# Patient Record
Sex: Female | Born: 1965 | Race: White | Hispanic: No | Smoking: Never smoker
Health system: Southern US, Community
[De-identification: ages and names within clinical notes are randomized; demographics above are authoritative.]

## PROBLEM LIST (undated history)

## (undated) HISTORY — PX: CHOLECYSTECTOMY: SHX55

---

## 1996-07-18 HISTORY — PX: TUBAL LIGATION: SHX77

## 1998-08-14 HISTORY — PX: CHOLECYSTECTOMY: SHX55

## 2002-02-17 HISTORY — PX: BREAST BIOPSY: SHX20

## 2004-01-29 ENCOUNTER — Emergency Department (HOSPITAL_COMMUNITY): Admission: EM | Admit: 2004-01-29 | Discharge: 2004-01-29 | Payer: Self-pay | Admitting: Emergency Medicine

## 2004-08-23 ENCOUNTER — Encounter: Admission: RE | Admit: 2004-08-23 | Discharge: 2004-08-23 | Payer: Self-pay | Admitting: Family Medicine

## 2007-04-30 ENCOUNTER — Emergency Department (HOSPITAL_COMMUNITY): Admission: EM | Admit: 2007-04-30 | Discharge: 2007-04-30 | Payer: Self-pay | Admitting: Emergency Medicine

## 2009-06-22 ENCOUNTER — Encounter: Admission: RE | Admit: 2009-06-22 | Discharge: 2009-06-22 | Payer: Self-pay | Admitting: Family Medicine

## 2010-08-08 ENCOUNTER — Encounter: Payer: Self-pay | Admitting: Family Medicine

## 2010-08-26 ENCOUNTER — Other Ambulatory Visit: Payer: Self-pay | Admitting: Obstetrics and Gynecology

## 2010-09-09 ENCOUNTER — Other Ambulatory Visit (HOSPITAL_COMMUNITY): Payer: Self-pay

## 2010-12-14 ENCOUNTER — Other Ambulatory Visit: Payer: Self-pay | Admitting: Family Medicine

## 2010-12-14 DIAGNOSIS — Z1231 Encounter for screening mammogram for malignant neoplasm of breast: Secondary | ICD-10-CM

## 2014-12-06 ENCOUNTER — Emergency Department (INDEPENDENT_AMBULATORY_CARE_PROVIDER_SITE_OTHER): Payer: BLUE CROSS/BLUE SHIELD

## 2014-12-06 ENCOUNTER — Emergency Department (HOSPITAL_COMMUNITY)
Admission: EM | Admit: 2014-12-06 | Discharge: 2014-12-06 | Disposition: A | Discharge status: Home / Self Care | Payer: Self-pay | Source: Home / Self Care

## 2014-12-06 ENCOUNTER — Encounter (HOSPITAL_COMMUNITY): Payer: Self-pay | Admitting: Emergency Medicine

## 2014-12-06 ENCOUNTER — Emergency Department (INDEPENDENT_AMBULATORY_CARE_PROVIDER_SITE_OTHER)
Admission: EM | Admit: 2014-12-06 | Discharge: 2014-12-06 | Disposition: A | Discharge status: Home / Self Care | Payer: BLUE CROSS/BLUE SHIELD | Source: Home / Self Care | Attending: Family Medicine | Admitting: Family Medicine

## 2014-12-06 DIAGNOSIS — M7741 Metatarsalgia, right foot: Secondary | ICD-10-CM | POA: Diagnosis not present

## 2014-12-06 MED ORDER — DICLOFENAC SODIUM 75 MG PO TBEC: 75.0000 mg | DELAYED_RELEASE_TABLET | Freq: Two times a day (BID) | ORAL | Status: DC

## 2014-12-06 NOTE — ED Provider Notes (Signed)
CSN: [id number]     Arrival date & time 12/06/14  1311 History   First MD Initiated Contact with Patient 12/06/14 1326     Chief Complaint  Patient presents with  . Foot Pain   (Consider location/radiation/quality/duration/timing/severity/associated sxs/prior Treatment) HPI  Right foot pain. Started 2 months ago after box fell on that work. Box fell on the top of her foot though her bottom is probably on the bottom of the foot at the ball of the foot. Pain worsens over the course of the day as patient is on her feet. She works on her feet all day. Pain has improved with the change in footwear recently. Overall the pain is unchanged since onset. Occasional swelling on the top of the foot. Ibuprofen with some relief. Pain comes and goes. Denies any numbness in the toes.  Denies fevers, other joint effusions or involvement, and explained rashes,.  History reviewed. No pertinent past medical history. Past Surgical History  Procedure Laterality Date  . Cholecystectomy     Family History  Problem Relation Age of Onset  . Asthma Neg Hx   . Cancer Neg Hx   . Diabetes Neg Hx   . Heart failure Neg Hx   . Hyperlipidemia Neg Hx    History  Substance Use Topics  . Smoking status: Never Smoker   . Smokeless tobacco: Not on file  . Alcohol Use: No   OB History    No data available     Review of Systems Per HPI with all other pertinent systems negative.   Allergies  Review of patient's allergies indicates no known allergies.  Home Medications   Prior to Admission medications   Not on File   BP 162/90 mmHg  Pulse 89  Temp(Src) 98.5 F (36.9 C) (Oral)  SpO2 97%  LMP 11/16/2014 Physical Exam Physical Exam  Constitutional: oriented to person, place, and time. appears well-developed and well-nourished. No distress.  HENT:  Head: Normocephalic and atraumatic.  Eyes: EOMI. PERRL.  Neck: Normal range of motion.  Cardiovascular: RRR, no m/r/g, 2+ distal pulses,  Pulmonary/Chest:  Effort normal and breath sounds normal. No respiratory distress.  Abdominal: Soft. Bowel sounds are normal. NonTTP, no distension.  Musculoskeletal: Right foot with normal range of motion, no appreciable effusions or swelling. Per patient pain is somewhat relieved with additional pressure applied to the second to third MTPs.  Neurological: alert and oriented to person, place, and time.  Skin: Skin is warm. No rash noted. non diaphoretic.  Psychiatric: normal mood and affect. behavior is normal. Judgment and thought content normal.   ED Course  Procedures (including critical care time) Labs Review Labs Reviewed - No data to display  Imaging Review No results found.   MDM   1. Metatarsalgia, right     Plain film of the right foot without evidence of fracture or other acute abnormality. Patiently start using metatarsal pads and seek further medical attention at sports medicine for possible custom orthotics if needed. Anti-inflammatories as needed.   Hypertension: Single incident of elevation. Patient without history of previous treatment or history of limited blood pressure. Will follow with PCP if needed.    Ozella Rocksavid J Merrell, MD 12/06/14 1409

## 2014-12-06 NOTE — ED Notes (Signed)
Pt comes in with throb,burning pain across right posterior foot x 2 mnths intermit. States a heavy box fell on toes 2 mnths ago, with worsening pain Pt has tried OTC Bio-freeze, ice and meds without relief

## 2014-12-06 NOTE — Discharge Instructions (Signed)
There is no evidence of fracture in her foot. Her symptoms are likely due to a condition called metatarsalgia. Please purchase and start using a metatarsal pad. Please use ibuprofen for additional pain relief. You may need to go and have custom orthotics made for your foot to help with pain, and for additional support

## 2016-02-01 ENCOUNTER — Other Ambulatory Visit: Payer: Self-pay | Admitting: Family Medicine

## 2016-02-01 DIAGNOSIS — R52 Pain, unspecified: Secondary | ICD-10-CM

## 2016-02-25 ENCOUNTER — Ambulatory Visit: Payer: Self-pay | Admitting: Family

## 2020-11-16 ENCOUNTER — Other Ambulatory Visit: Payer: Self-pay | Admitting: Obstetrics and Gynecology

## 2020-11-16 DIAGNOSIS — Z1231 Encounter for screening mammogram for malignant neoplasm of breast: Secondary | ICD-10-CM

## 2020-12-10 ENCOUNTER — Ambulatory Visit: Admission: RE | Admit: 2020-12-10 | Payer: BLUE CROSS/BLUE SHIELD | Source: Ambulatory Visit

## 2020-12-10 ENCOUNTER — Other Ambulatory Visit: Payer: Self-pay

## 2020-12-10 ENCOUNTER — Ambulatory Visit: Payer: Self-pay | Admitting: *Deleted

## 2020-12-10 VITALS — BP 138/88 | Wt 209.9 lb

## 2020-12-10 DIAGNOSIS — N644 Mastodynia: Secondary | ICD-10-CM

## 2020-12-10 DIAGNOSIS — Z01419 Encounter for gynecological examination (general) (routine) without abnormal findings: Secondary | ICD-10-CM

## 2020-12-10 NOTE — Progress Notes (Signed)
[name] is a 55 y.o. G2P0 female who presents to [facility] clinic today with complaint of right outer breast pain near biopsy site x 2 months. Patient states the pain comes and goes. Patient rates the pain at a 6 out of 10..    Pap Smear: Pap smear completed today. Last Pap smear was 3-4 years ago at [facility] health care at [facility] clinic and was normal per patient. Per patient has no history of an abnormal Pap smear. Last Pap smear result is not available in Epic.   Physical exam: Breasts Breasts symmetrical. No skin abnormalities bilateral breasts. No nipple retraction bilateral breasts. No nipple discharge bilateral breasts. No lymphadenopathy. No lumps palpated bilateral breasts. Complaints of right upper outer quadrant pain on exam.      Pelvic/Bimanual Ext Genitalia No lesions, no swelling and no discharge observed on external genitalia.        Vagina Vagina pink and normal texture. No lesions or discharge observed in vagina.        Cervix Cervix is present. Cervix pink and of normal texture. No discharge observed.    Uterus Uterus is present and palpable. Uterus in normal position and normal size.        Adnexae Bilateral ovaries present and palpable. No tenderness on palpation.         Rectovaginal No rectal exam completed today since patient had no rectal complaints. No skin abnormalities observed on exam.     Smoking History: Patient has never smoked.  Patient Navigation: Patient education provided. Access to services provided for patient through [facility] program.   Colorectal Cancer Screening: Per patient has never had colonoscopy completed. No complaints today.    Breast and Cervical Cancer Risk Assessment: Patient does not have family history of breast cancer, known genetic mutations, or radiation treatment to the chest before age 81. Patient does not have history of cervical dysplasia, immunocompromised, or DES exposure in-utero.  Risk  Assessment    Risk Scores      12/10/2020   Last edited by: Meryl Dare, CMA   5-year risk: 1 %   Lifetime risk: 6.9 %         A: [facility] exam with pap smear Complaint of right outer breast pain.  P: Referred patient to the [facility] for a diagnostic mammogram. Appointment scheduled Thursday, December 31, 2020 at 0940.  Priscille Heidelberg, RN 12/10/2020 9:38 AM

## 2020-12-10 NOTE — Patient Instructions (Addendum)
Explained breast self awareness with [facility]. Pap smear completed today. Let her know [facility] will cover Pap smears and HPV typing every 5 years unless has a history of abnormal Pap smears. Referred patient to the [facility] for a diagnostic mammogram. Appointment scheduled Thursday, December 31, 2020 at 0940. Patient aware of appointment and will be there. Let patient know will follow-up with her within the next couple of weeks with results of her Pap smear by phone or letter. Oprah Camarena verbalized understanding.  Gigi Onstad, Kathaleen Maser, RN 9:38 AM

## 2020-12-15 ENCOUNTER — Telehealth: Payer: Self-pay

## 2020-12-15 LAB — CYTOLOGY - PAP
Adequacy: ABSENT
Comment: NEGATIVE
Diagnosis: NEGATIVE
Diagnosis: REACTIVE
High risk HPV: NEGATIVE

## 2020-12-15 NOTE — Telephone Encounter (Signed)
Left message for patient about pap results. Left name and number for patient to call back.

## 2020-12-15 NOTE — Telephone Encounter (Signed)
Spoke with patient to give pap smear results. Informed patient that pap was normal and HPV was negative. Next pap smear will be due in 5 years. Patient voiced understanding.

## 2020-12-31 ENCOUNTER — Other Ambulatory Visit: Payer: Self-pay

## 2020-12-31 ENCOUNTER — Ambulatory Visit
Admission: RE | Admit: 2020-12-31 | Discharge: 2020-12-31 | Disposition: A | Discharge status: Home / Self Care | Payer: No Typology Code available for payment source | Source: Ambulatory Visit | Attending: Obstetrics and Gynecology | Admitting: Obstetrics and Gynecology

## 2020-12-31 ENCOUNTER — Ambulatory Visit
Admission: RE | Admit: 2020-12-31 | Discharge: 2020-12-31 | Disposition: A | Discharge status: Home / Self Care | Payer: BLUE CROSS/BLUE SHIELD | Source: Ambulatory Visit | Attending: Obstetrics and Gynecology | Admitting: Obstetrics and Gynecology

## 2020-12-31 DIAGNOSIS — N644 Mastodynia: Secondary | ICD-10-CM

## 2022-01-31 ENCOUNTER — Other Ambulatory Visit (HOSPITAL_COMMUNITY): Payer: Self-pay | Admitting: Obstetrics and Gynecology

## 2022-01-31 DIAGNOSIS — Z1231 Encounter for screening mammogram for malignant neoplasm of breast: Secondary | ICD-10-CM

## 2022-03-11 ENCOUNTER — Inpatient Hospital Stay: Payer: Self-pay

## 2022-03-11 ENCOUNTER — Inpatient Hospital Stay (HOSPITAL_COMMUNITY): Admission: RE | Admit: 2022-03-11 | Payer: Self-pay | Source: Ambulatory Visit

## 2022-06-03 IMAGING — MG DIGITAL DIAGNOSTIC BILAT W/ TOMO W/ CAD
8 of 15 series · 8 of 40 positions shown · non-contrast
Comparison: Previous exam(s).

CLINICAL DATA: 54-year-old female presenting with focal pain in the
right breast. Patient reports a work injury approximately 1.5 months
ago with bruising in the right breast. The bruising has resolved but
there is residual tenderness. No associated lump.

EXAM:
DIGITAL DIAGNOSTIC BILATERAL MAMMOGRAM WITH TOMOSYNTHESIS AND CAD;
ULTRASOUND RIGHT BREAST LIMITED
TECHNIQUE: Bilateral digital diagnostic mammography and breast tomosynthesis
was performed. The images were evaluated with computer-aided
detection.; Targeted ultrasound examination of the right breast was
performed

[R MLO synth-2D (1 of 2)]
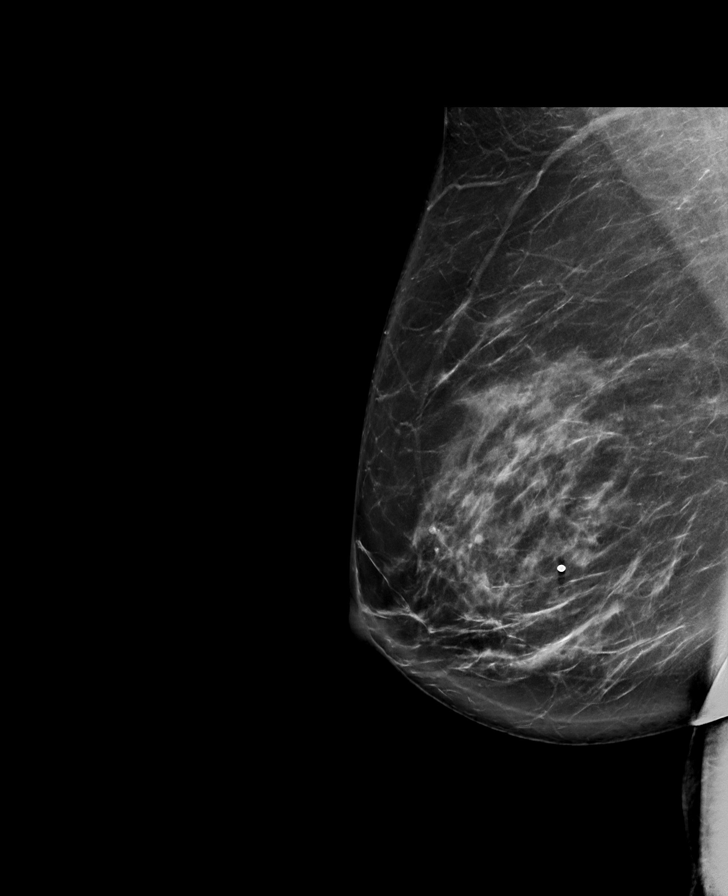

[R CC synth-2D (1 of 3)]
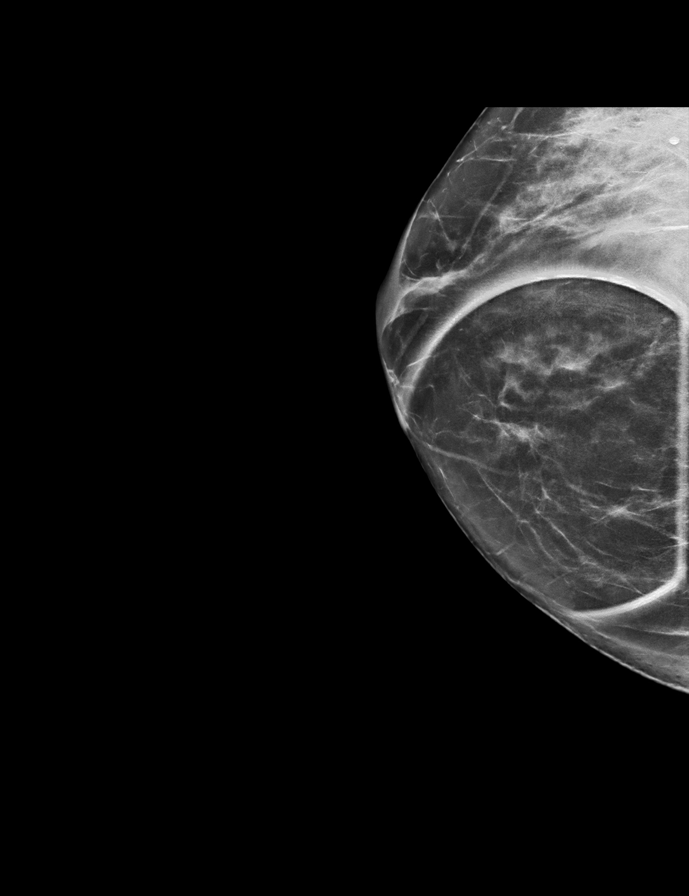

[R MLO synth-2D (2 of 2)]
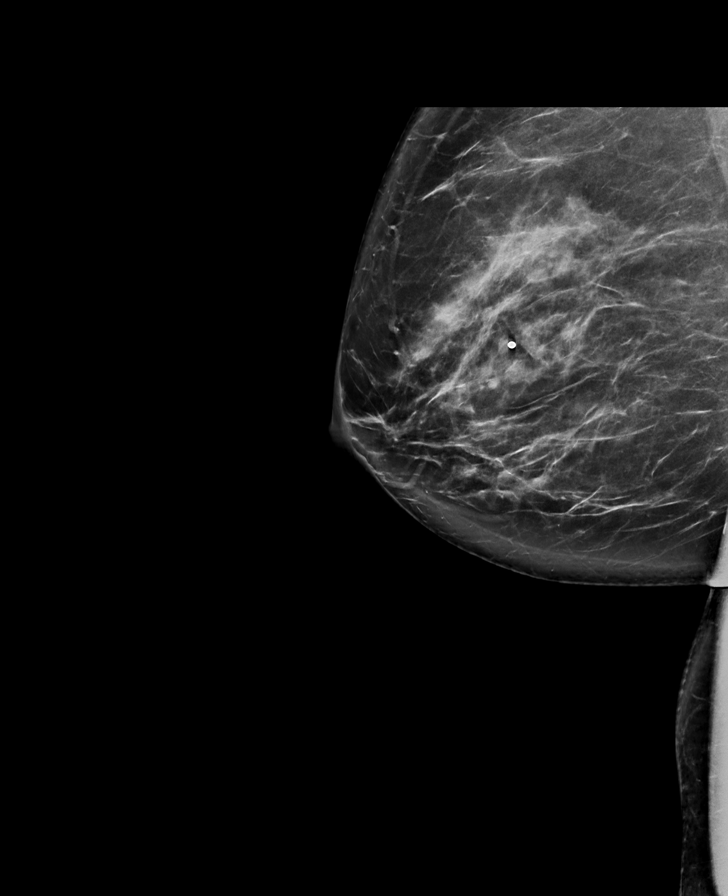

[R CC synth-2D (2 of 3)]
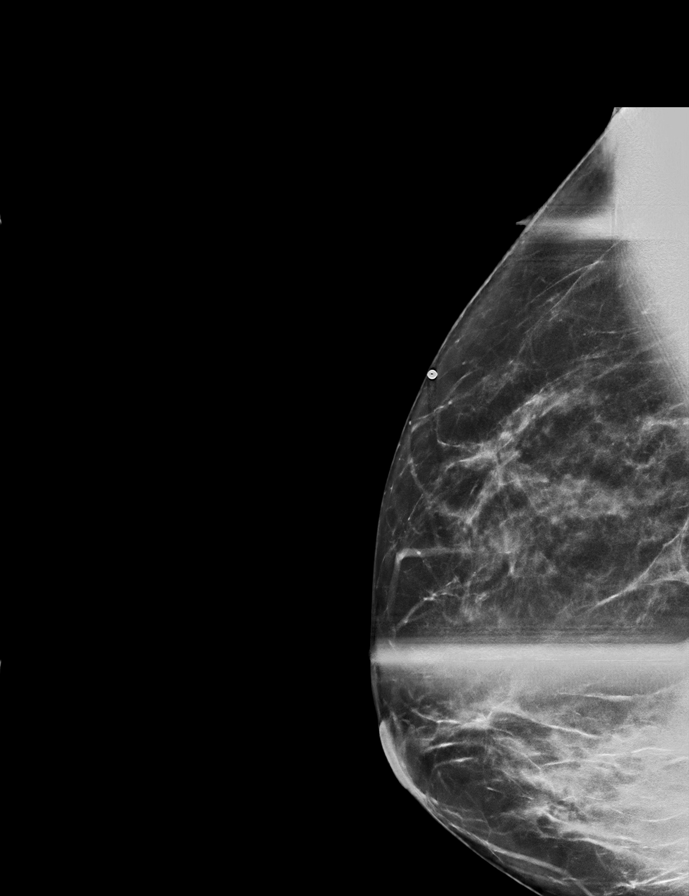

[R CC synth-2D (3 of 3)]
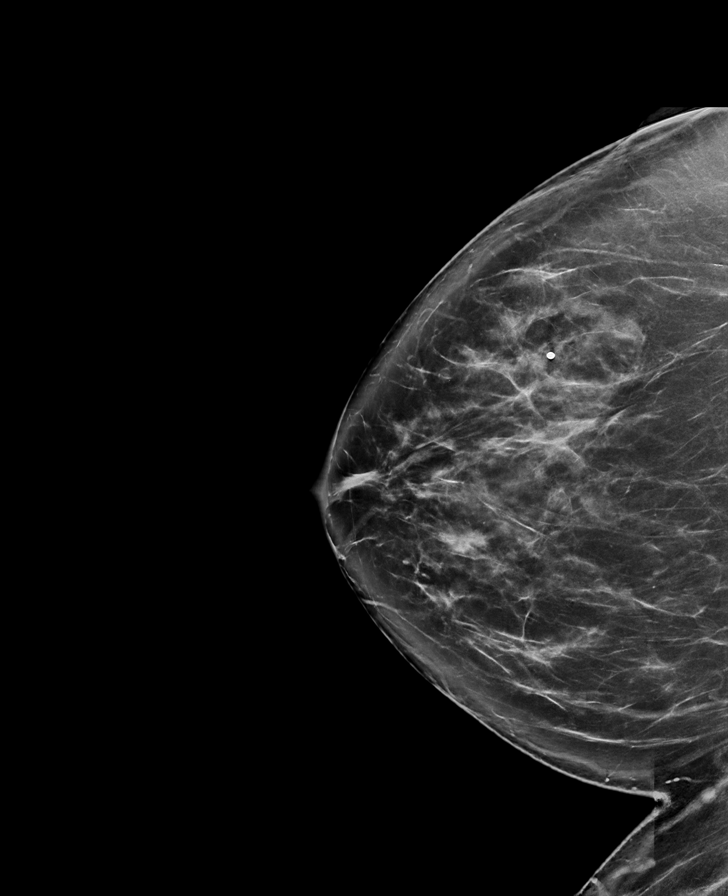

[R ML synth-2D]
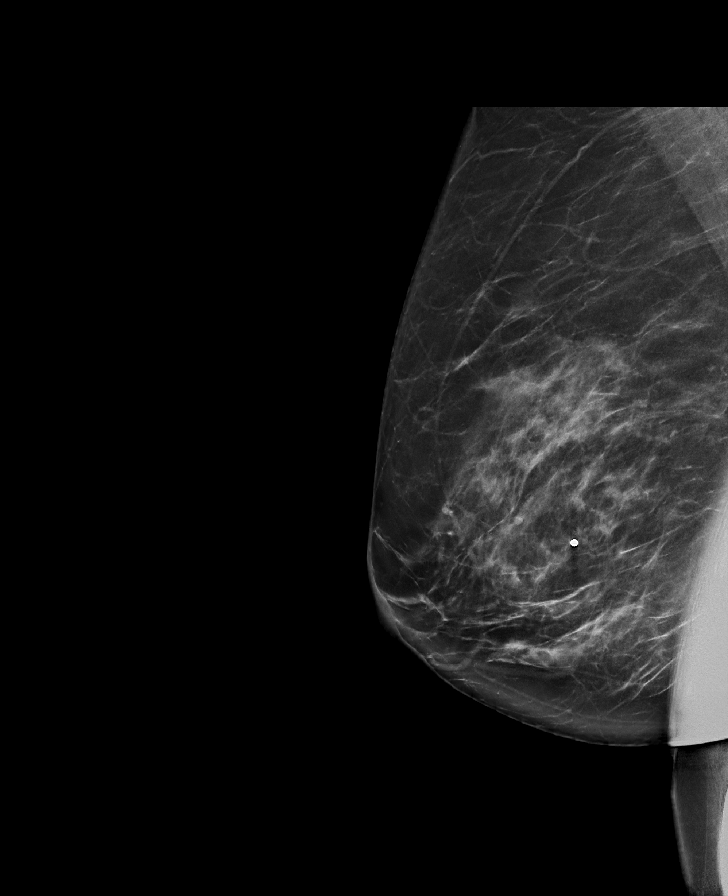

[L MLO synth-2D]
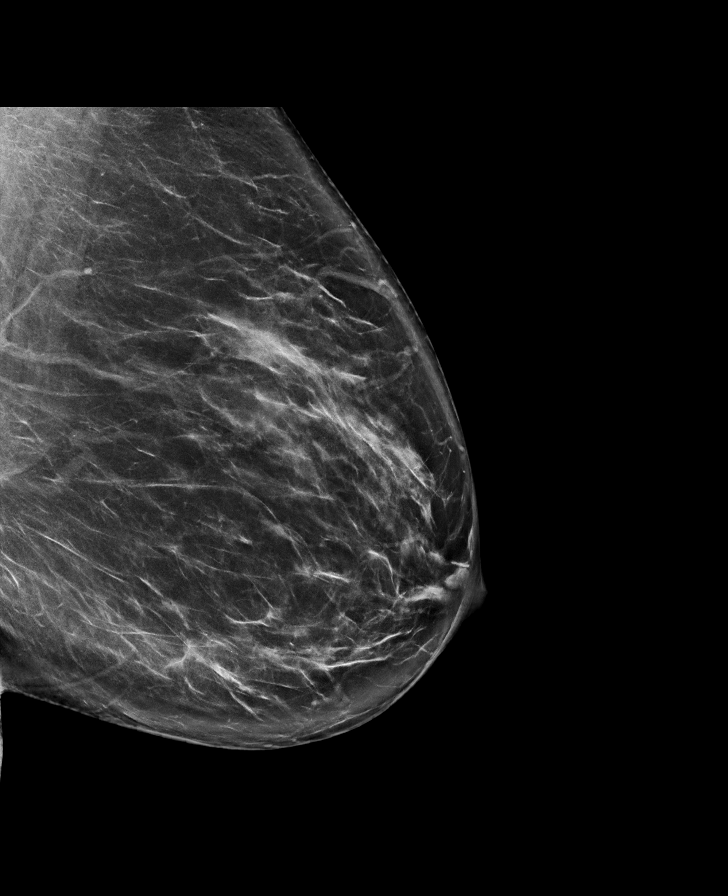

[R ML tomo · tomo slice 58/85.0]
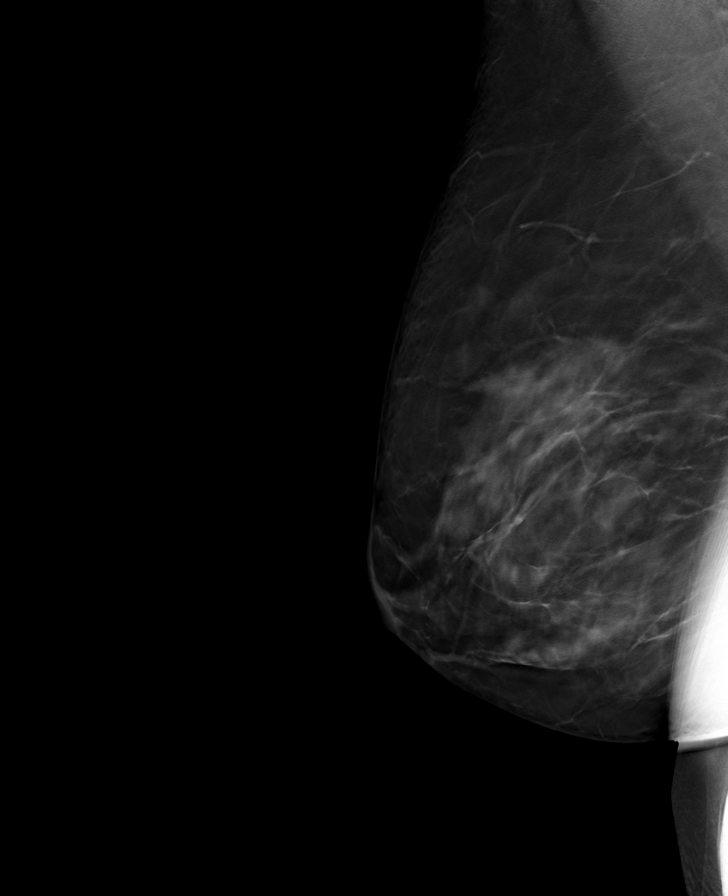

[8 of 40 positions shown; findings below may reference images not displayed]

ACR Breast Density Category b: There are scattered areas of
fibroglandular density.
FINDINGS: Mammogram:

Right breast: A skin BB marks the site of concern reported by the
patient in the outer right breast. A spot tangential view of this
area is performed in addition to standard views demonstrating no
abnormality. Additionally spot compression tomosynthesis views of
the medial breast were performed for a possible asymmetry, which did
not persist on the spot imaging.

Left breast: No suspicious mass, distortion, or microcalcifications
are identified to suggest presence of malignancy.

On physical exam at the site of pain reported by the patient in the
outer right breast I do not appreciate any bruising or skin changes.
There is no palpable lump.

Ultrasound:

Targeted ultrasound performed at the site of pain reported by the
patient in the right breast at 8 o'clock 4 cm from the nipple
demonstrating no cystic or solid mass.

Targeted ultrasound performed in the right breast upper inner
quadrant. At 3 o'clock 2 cm from nipple demonstrating benign
fibrocystic changes, which may correspond to the asymmetry
identified mammographically. No suspicious solid mass.
IMPRESSION: 1. At the palpable site of concern in the right breast there is no
mammographic or sonographic evidence of malignancy or other imaging
abnormality.

2. In the right breast at 3 o'clock there are benign fibrocystic
changes.

RECOMMENDATION:
1. Clinical follow-up as needed for the right breast pain, which is
likely related to prior trauma.

2.  Screening mammogram in one year.(Code:NS-Y-4AF)

I have discussed the findings and recommendations with the patient.
If applicable, a reminder letter will be sent to the patient
regarding the next appointment.

BI-RADS CATEGORY  2: Benign.

## 2022-11-10 ENCOUNTER — Encounter: Payer: Self-pay | Admitting: Family Medicine

## 2022-11-10 ENCOUNTER — Ambulatory Visit: Payer: Medicaid Other | Admitting: Family Medicine

## 2022-11-10 VITALS — BP 163/81 | HR 62 | Temp 97.8°F | Ht 62.0 in | Wt 168.4 lb

## 2022-11-10 DIAGNOSIS — R030 Elevated blood-pressure reading, without diagnosis of hypertension: Secondary | ICD-10-CM | POA: Insufficient documentation

## 2022-11-10 DIAGNOSIS — Z114 Encounter for screening for human immunodeficiency virus [HIV]: Secondary | ICD-10-CM

## 2022-11-10 DIAGNOSIS — Z6830 Body mass index (BMI) 30.0-30.9, adult: Secondary | ICD-10-CM | POA: Insufficient documentation

## 2022-11-10 DIAGNOSIS — Z780 Asymptomatic menopausal state: Secondary | ICD-10-CM | POA: Diagnosis not present

## 2022-11-10 DIAGNOSIS — E559 Vitamin D deficiency, unspecified: Secondary | ICD-10-CM | POA: Diagnosis not present

## 2022-11-10 DIAGNOSIS — Z1159 Encounter for screening for other viral diseases: Secondary | ICD-10-CM

## 2022-11-10 DIAGNOSIS — R4701 Aphasia: Secondary | ICD-10-CM

## 2022-11-10 DIAGNOSIS — R6889 Other general symptoms and signs: Secondary | ICD-10-CM | POA: Diagnosis not present

## 2022-11-10 DIAGNOSIS — Z136 Encounter for screening for cardiovascular disorders: Secondary | ICD-10-CM | POA: Diagnosis not present

## 2022-11-10 NOTE — Progress Notes (Signed)
Subjective:  Patient ID: Lori Obrien, female    DOB: [birth year], 57 y.o.   MRN: [id number]  Patient Care Team: Sonny Masters, FNP as PCP - General (Family Medicine)   Chief Complaint:  New Patient (Initial Visit) (Previous [facility] patient ) and Establish Care (Patient states she has trouble with her words at time x 1 year on and off. )   HPI: Lori Obrien is a 57 y.o. female presenting on 11/10/2022 for New Patient (Initial Visit) (Previous [facility] patient ) and Establish Care (Patient states she has trouble with her words at time x 1 year on and off. )   Pt presents today to establish care with new PCP. She has not been followed by a PCP in several years. She has had her mammograms and PAPs completed at the community outreach program. Last completed 2022. She is postmenopausal, last menstrual cycle over 18 months ago, denies HRT or need for HRT. She has had her GB removed. She is only taking a multivitamin daily. Her biggest concern today is elevated blood pressure readings and trouble with word finding at times. States 1-2 times per month she will have difficulty getting words out, states she will know what she is thinking but can not find the correct word to day. Denies other associated symptoms. States this has been going on for several months. Not worsening.    Relevant past medical, surgical, family, and social history reviewed and updated as indicated.  Allergies and medications reviewed and updated. Data reviewed: Chart in Epic.   History reviewed. No pertinent past medical history.  Past Surgical History:  Procedure Laterality Date   BREAST BIOPSY  02/17/2002   CHOLECYSTECTOMY  08/14/1998   TUBAL LIGATION  1998    Social History   Socioeconomic History   Marital status: Divorced    Spouse name: Not on file   Number of children: Not on file   Years of education: Not on file   Highest education level: Not on file  Occupational History   Not on file  Tobacco Use    Smoking status: Never   Smokeless tobacco: Never  Vaping Use   Vaping Use: Never used  Substance and Sexual Activity   Alcohol use: No   Drug use: No   Sexual activity: Not Currently    Birth control/protection: None  Other Topics Concern   Not on file  Social History Narrative   Not on file   Social Determinants of Health   Financial Resource Strain: Not on file  Food Insecurity: Not on file  Transportation Needs: No Transportation Needs (12/10/2020)   PRAPARE - Transportation    Lack of Transportation (Medical): No    Lack of Transportation (Non-Medical): No  Physical Activity: Not on file  Stress: Not on file  Social Connections: Not on file  Intimate Partner Violence: Not on file    Outpatient Encounter Medications as of 11/10/2022  Medication Sig   [DISCONTINUED] diclofenac (VOLTAREN) 75 MG EC tablet Take 1 tablet (75 mg total) by mouth 2 (two) times daily. (Patient not taking: Reported on 12/10/2020)   No facility-administered encounter medications on file as of 11/10/2022.    No Known Allergies  Review of Systems  Constitutional:  Negative for activity change, appetite change, chills, fatigue and fever.  HENT: Negative.    Eyes: Negative.   Respiratory:  Negative for cough, chest tightness and shortness of breath.   Cardiovascular:  Negative for chest pain, palpitations and leg swelling.  Gastrointestinal:  Negative for blood in stool, constipation, diarrhea, nausea and vomiting.  Endocrine: Negative.   Genitourinary:  Negative for dysuria, frequency and urgency.  Musculoskeletal:  Negative for arthralgias and myalgias.  Skin: Negative.   Allergic/Immunologic: Negative.   Neurological:  Negative for dizziness, tremors, seizures, syncope, facial asymmetry, speech difficulty, weakness, light-headedness, numbness and headaches.       Difficulty with expressing certain words at times. Happens 1-2 times per month, nothing persistent, no other symptoms.  Hematological:  Negative.   Psychiatric/Behavioral:  Negative for confusion, hallucinations, sleep disturbance and suicidal ideas.   All other systems reviewed and are negative.       Objective:  BP (!) 163/81   Pulse 62   Temp 97.8 F (36.6 C) (Temporal)   Ht  (1.575 m)   Wt 168 lb 6.4 oz (76.4 kg)   LMP 11/16/2014   SpO2 99%   BMI 30.80 kg/m    Wt Readings from Last 3 Encounters:  11/10/22 168 lb 6.4 oz (76.4 kg)  12/10/20 209 lb 14.4 oz (95.2 kg)    Physical Exam Vitals and nursing note reviewed.  Constitutional:      General: She is not in acute distress.    Appearance: Normal appearance. She is well-developed and well-groomed. She is obese. She is not ill-appearing, toxic-appearing or diaphoretic.  HENT:     Head: Normocephalic and atraumatic.     Jaw: There is normal jaw occlusion.     Right Ear: Hearing, tympanic membrane, ear canal and external ear normal.     Left Ear: Hearing, tympanic membrane, ear canal and external ear normal.     Nose: Nose normal.     Mouth/Throat:     Lips: Pink.     Mouth: Mucous membranes are moist.     Pharynx: Oropharynx is clear. Uvula midline.  Eyes:     General: Lids are normal.     Extraocular Movements: Extraocular movements intact.     Conjunctiva/sclera: Conjunctivae normal.     Pupils: Pupils are equal, round, and reactive to light.  Neck:     Thyroid: No thyroid mass, thyromegaly or thyroid tenderness.     Vascular: No carotid bruit or JVD.     Trachea: Trachea and phonation normal.  Cardiovascular:     Rate and Rhythm: Normal rate and regular rhythm.     Chest Wall: PMI is not displaced.     Pulses: Normal pulses.     Heart sounds: Normal heart sounds. No murmur heard.    No friction rub. No gallop.  Pulmonary:     Effort: Pulmonary effort is normal. No respiratory distress.     Breath sounds: Normal breath sounds. No wheezing.  Abdominal:     General: Bowel sounds are normal. There is no distension or abdominal bruit.      Palpations: Abdomen is soft. There is no hepatomegaly or splenomegaly.     Tenderness: There is no abdominal tenderness. There is no right CVA tenderness or left CVA tenderness.     Hernia: No hernia is present.  Musculoskeletal:        General: Normal range of motion.     Cervical back: Normal range of motion and neck supple.     Right lower leg: No edema.     Left lower leg: No edema.  Lymphadenopathy:     Cervical: No cervical adenopathy.  Skin:    General: Skin is warm and dry.     Capillary Refill: Capillary  refill takes less than 2 seconds.     Coloration: Skin is not cyanotic, jaundiced or pale.     Findings: No rash.  Neurological:     General: No focal deficit present.     Mental Status: She is alert and oriented to person, place, and time.     Sensory: Sensation is intact.     Motor: Motor function is intact.     Coordination: Coordination is intact.     Gait: Gait is intact.     Deep Tendon Reflexes: Reflexes are normal and symmetric.  Psychiatric:        Attention and Perception: Attention and perception normal.        Mood and Affect: Mood and affect normal.        Speech: Speech normal.        Behavior: Behavior normal. Behavior is cooperative.        Thought Content: Thought content normal.        Cognition and Memory: Cognition and memory normal.        Judgment: Judgment normal.     Results for orders placed or performed in visit on 12/10/20  Cytology - PAP  Result Value Ref Range   High risk HPV Negative    Adequacy      Satisfactory for evaluation; transformation zone component ABSENT.   Comment Normal Reference Range HPV - Negative    Diagnosis      - Negative for Intraepithelial Lesions or Malignancy (NILM)   Diagnosis - Benign reactive/reparative changes        Pertinent labs & imaging results that were available during my care of the patient were reviewed by me and considered in my medical decision making.  Assessment & Plan:  Lori Obrien was seen  today for new patient (initial visit) and establish care.  Diagnoses and all orders for this visit:  Elevated blood pressure reading in office without diagnosis of hypertension DASH diet and exercise discussed in detail. BP log provided to pt. Labs pending. To make lifestyle changes and if not beneficial, will discuss medications.  -     CBC with Differential/Platelet -     CMP14+EGFR -     Lipid panel -     Thyroid Panel With TSH  BMI 30.0-30.9,adult Diet and exercise encouraged. Labs pending.  -     CBC with Differential/Platelet -     CMP14+EGFR -     Lipid panel -     Thyroid Panel With TSH  Postmenopausal Doing well. Not on HRT. Taking a multivitamin daily. Will check labs today.  -     CBC with Differential/Platelet -     CMP14+EGFR -     Lipid panel -     Thyroid Panel With TSH  Expressive aphasia Occurs 1-2 times per month, nothing consistent. Will check labs for potential underlying causes. No red flags present concerning for acute cerebrovascular event.  -     CBC with Differential/Platelet -     CMP14+EGFR -     Lipid panel -     Thyroid Panel With TSH  Vitamin D deficiency Will check labs today.  -     VITAMIN D 25 Hydroxy (Vit-D Deficiency, Fractures)  Screening for HIV (human immunodeficiency virus) -     HIV antibody (with reflex)  Need for hepatitis C screening test -     Hepatitis C Antibody     Continue all other maintenance medications.  Follow up plan: Return in about  3 months (around 02/09/2023), or if symptoms worsen or fail to improve, for BP.   Continue healthy lifestyle choices, including diet (rich in fruits, vegetables, and lean proteins, and low in salt and simple carbohydrates) and exercise (at least 30 minutes of moderate physical activity daily).  Educational handout given for DASH diet, HTN  The above assessment and management plan was discussed with the patient. The patient verbalized understanding of and has agreed to the  management plan. Patient is aware to call the clinic if they develop any new symptoms or if symptoms persist or worsen. Patient is aware when to return to the clinic for a follow-up visit. Patient educated on when it is appropriate to go to the emergency department.   Kari Baars, FNP-C [facility] [phone]

## 2022-11-10 NOTE — Patient Instructions (Addendum)

## 2022-11-11 LAB — CBC WITH DIFFERENTIAL/PLATELET
Basophils Absolute: 0 10*3/uL (ref 0.0–0.2)
Basos: 1 %
EOS (ABSOLUTE): 0.3 10*3/uL (ref 0.0–0.4)
Eos: 4 %
Hematocrit: 42.4 % (ref 34.0–46.6)
Hemoglobin: 13.7 g/dL (ref 11.1–15.9)
Immature Grans (Abs): 0 10*3/uL (ref 0.0–0.1)
Immature Granulocytes: 0 %
Lymphocytes Absolute: 2.5 10*3/uL (ref 0.7–3.1)
Lymphs: 43 %
MCH: 30 pg (ref 26.6–33.0)
MCHC: 32.3 g/dL (ref 31.5–35.7)
MCV: 93 fL (ref 79–97)
Monocytes Absolute: 0.3 10*3/uL (ref 0.1–0.9)
Monocytes: 5 %
Neutrophils Absolute: 2.8 10*3/uL (ref 1.4–7.0)
Neutrophils: 47 %
Platelets: 239 10*3/uL (ref 150–450)
RBC: 4.57 x10E6/uL (ref 3.77–5.28)
RDW: 13.1 % (ref 11.7–15.4)
WBC: 5.8 10*3/uL (ref 3.4–10.8)

## 2022-11-11 LAB — LIPID PANEL
Chol/HDL Ratio: 3.3 ratio (ref 0.0–4.4)
Cholesterol, Total: 205 mg/dL — ABNORMAL HIGH (ref 100–199)
HDL: 63 mg/dL (ref >39)
LDL Chol Calc (NIH): 130 mg/dL — ABNORMAL HIGH (ref 0–99)
Triglycerides: 66 mg/dL (ref 0–149)
VLDL Cholesterol Cal: 12 mg/dL (ref 5–40)

## 2022-11-11 LAB — THYROID PANEL WITH TSH
Free Thyroxine Index: 2.5 (ref 1.2–4.9)
T3 Uptake Ratio: 22 % — ABNORMAL LOW (ref 24–39)
T4, Total: 11.5 ug/dL (ref 4.5–12.0)
TSH: 2.98 u[IU]/mL (ref 0.450–4.500)

## 2022-11-11 LAB — CMP14+EGFR
ALT: 22 IU/L (ref 0–32)
AST: 18 IU/L (ref 0–40)
Albumin/Globulin Ratio: 1.7 (ref 1.2–2.2)
Albumin: 4.5 g/dL (ref 3.8–4.9)
Alkaline Phosphatase: 122 IU/L — ABNORMAL HIGH (ref 44–121)
BUN/Creatinine Ratio: 14 (ref 9–23)
BUN: 10 mg/dL (ref 6–24)
Bilirubin Total: 0.3 mg/dL (ref 0.0–1.2)
CO2: 23 mmol/L (ref 20–29)
Calcium: 9.4 mg/dL (ref 8.7–10.2)
Chloride: 102 mmol/L (ref 96–106)
Creatinine, Ser: 0.72 mg/dL (ref 0.57–1.00)
Globulin, Total: 2.6 g/dL (ref 1.5–4.5)
Glucose: 80 mg/dL (ref 70–99)
Potassium: 4 mmol/L (ref 3.5–5.2)
Sodium: 140 mmol/L (ref 134–144)
Total Protein: 7.1 g/dL (ref 6.0–8.5)
eGFR: 97 mL/min/{1.73_m2} (ref >59)

## 2022-11-11 LAB — HIV ANTIBODY (ROUTINE TESTING W REFLEX): HIV Screen 4th Generation wRfx: NONREACTIVE

## 2022-11-11 LAB — HEPATITIS C ANTIBODY: Hep C Virus Ab: NONREACTIVE

## 2022-11-11 LAB — VITAMIN D 25 HYDROXY (VIT D DEFICIENCY, FRACTURES): Vit D, 25-Hydroxy: 35.7 ng/mL (ref 30.0–100.0)

## 2023-01-23 ENCOUNTER — Telehealth: Payer: Self-pay

## 2023-01-23 NOTE — Telephone Encounter (Signed)
Chart review completed for patient. Patient is due for screening mammogram. Mychart message sent to patient to inquire about scheduling mammogram.  Galilee Pierron, Population Health Specialist.  

## 2023-02-09 ENCOUNTER — Ambulatory Visit: Payer: Medicaid Other | Admitting: Family Medicine

## 2024-05-27 DIAGNOSIS — K0510 Chronic gingivitis, plaque induced: Secondary | ICD-10-CM | POA: Diagnosis not present
